# Patient Record
Sex: Male | Born: 1970 | Race: White | Hispanic: No | Marital: Married | Smoking: Never smoker
Health system: Southern US, Community
[De-identification: ages and names within clinical notes are randomized; demographics above are authoritative.]

## PROBLEM LIST (undated history)

## (undated) DIAGNOSIS — I10 Essential (primary) hypertension: Secondary | ICD-10-CM

## (undated) HISTORY — DX: Essential (primary) hypertension: I10

## (undated) HISTORY — PX: WISDOM TOOTH EXTRACTION: SHX21

---

## 2000-09-18 ENCOUNTER — Emergency Department (HOSPITAL_COMMUNITY): Admission: EM | Admit: 2000-09-18 | Discharge: 2000-09-18 | Payer: Self-pay

## 2002-04-15 ENCOUNTER — Emergency Department (HOSPITAL_COMMUNITY): Admission: EM | Admit: 2002-04-15 | Discharge: 2002-04-15 | Payer: Self-pay | Admitting: Emergency Medicine

## 2005-08-23 ENCOUNTER — Encounter: Admission: RE | Admit: 2005-08-23 | Discharge: 2005-08-23 | Payer: Self-pay | Admitting: Occupational Medicine

## 2007-07-25 ENCOUNTER — Encounter: Admission: RE | Admit: 2007-07-25 | Discharge: 2007-07-25 | Payer: Self-pay | Admitting: Occupational Medicine

## 2009-07-21 ENCOUNTER — Encounter: Admission: RE | Admit: 2009-07-21 | Discharge: 2009-07-21 | Payer: Self-pay | Admitting: Occupational Medicine

## 2013-09-29 ENCOUNTER — Ambulatory Visit: Payer: Self-pay

## 2013-09-29 ENCOUNTER — Other Ambulatory Visit: Payer: Self-pay | Admitting: Occupational Medicine

## 2013-09-29 DIAGNOSIS — Z0000 Encounter for general adult medical examination without abnormal findings: Secondary | ICD-10-CM

## 2015-07-14 ENCOUNTER — Ambulatory Visit (INDEPENDENT_AMBULATORY_CARE_PROVIDER_SITE_OTHER): Payer: BLUE CROSS/BLUE SHIELD | Admitting: Family Medicine

## 2015-07-14 VITALS — BP 118/84 | HR 66 | Temp 97.9°F | Resp 16 | Ht 67.0 in | Wt 182.2 lb

## 2015-07-14 DIAGNOSIS — R059 Cough, unspecified: Secondary | ICD-10-CM

## 2015-07-14 DIAGNOSIS — R6889 Other general symptoms and signs: Secondary | ICD-10-CM | POA: Diagnosis not present

## 2015-07-14 DIAGNOSIS — R05 Cough: Secondary | ICD-10-CM

## 2015-07-14 LAB — POCT INFLUENZA A/B
INFLUENZA A, POC: NEGATIVE
INFLUENZA B, POC: POSITIVE — AB

## 2015-07-14 MED ORDER — HYDROCOD POLST-CPM POLST ER 10-8 MG/5ML PO SUER: 5.0000 mL | Freq: Two times a day (BID) | ORAL | Status: DC | PRN

## 2015-07-14 MED ORDER — BENZONATATE 100 MG PO CAPS: 100.0000 mg | ORAL_CAPSULE | Freq: Three times a day (TID) | ORAL | Status: DC | PRN

## 2015-07-14 NOTE — Addendum Note (Signed)
Addended by: Felix AhmadiFRANSEN, Mathis Cashman A on: 07/14/2015 03:19 PM   Modules accepted: Kipp BroodSmartSet

## 2015-07-14 NOTE — Progress Notes (Signed)
Patient ID: William Ritter, male    DOB: 01/16/1971  Age: 45 y.o. MRN: [id number]  Chief Complaint  Patient presents with  . Sinusitis    x friday  . Sore Throat    x friday  . Cough    x friday  . Chills    Subjective:    45 year old man who is been sick since Saturday. He worked a shift on Friday and a long shift on Saturday.  He started with a cough Saturday. He has had body aches and chills. He went to work on Monday, but was still coughing terribly yesterday and today. He came in with his wife who is also ill. He does not smoke. He is not on any regular medications. He is generally a healthy person. Some head congestion. Mild sore throat. No ear pain. He does not smoke.  Current allergies, medications, problem list, past/family and social histories reviewed.  Objective:  BP 118/84 mmHg  Pulse 66  Temp(Src) 97.9 F (36.6 C) (Oral)  Resp 16  Ht  (1.702 m)  Wt 182 lb 3.2 oz (82.645 kg)  BMI 28.53 kg/m2  SpO2 98%  His TMs are normal. He is coughing constantly. His throat is clear but mildly erythematous. Neck supple without significant nodes. Chest clear to auscultation without wheezing currently though he says he has wheezes some. Heart is regular without murmurs.  Assessment & Plan:   Assessment: 1. Flu-like symptoms   2. Cough       Plan: Cough and flulike illness  Orders Placed This Encounter  Procedures  . POCT Influenza A/B    Meds ordered this encounter  Medications  . chlorpheniramine-HYDROcodone (TUSSIONEX PENNKINETIC ER) 10-8 MG/5ML SUER    Sig: Take 5 mLs by mouth every 12 (twelve) hours as needed for cough.    Dispense:  140 mL    Refill:  0  . benzonatate (TESSALON) 100 MG capsule    Sig: Take 1-2 capsules (100-200 mg total) by mouth 3 (three) times daily as needed.    Dispense:  30 capsule    Refill:  0   Results for orders placed or performed in visit on 07/14/15  POCT Influenza A/B  Result Value Ref Range   Influenza A, POC Negative  Negative   Influenza B, POC Positive (A) Negative         Patient Instructions  Drink plenty of fluids and get enough rest  Take the Tussionex cough syrup 1 teaspoon every 12 hours as needed for cough  Take the cough pills one or 2 pills 3 times daily as needed for cough. This tends to be nonsedating.  If you develop a lot of head congestion you can use an over-the-counter antihistamine decongestant such as Claritin-D or Allegra-D or Zyrtec-D  Return if worse  IF you received an x-ray today, you will receive an invoice from [facility]. Please contact [facility] at [phone] with questions or concerns regarding your invoice.   IF you received labwork today, you will receive an invoice from [employer] [facility]. Please contact [facility] at [phone] with questions or concerns regarding your invoice.   Our billing staff will not be able to assist you with questions regarding bills from these companies.  You will be contacted with the lab results as soon as they are available. The fastest way to get your results is to activate your My Chart account. Instructions are located on the last page of this paperwork. If you have not heard  from us regarding the results in 2 weeks, please contact this office.       Return if symptoms worsen or fail to improve.   Adleigh Mcmasters, MD 07/14/2015

## 2015-07-14 NOTE — Patient Instructions (Addendum)
Drink plenty of fluids and get enough rest  Take the Tussionex cough syrup 1 teaspoon every 12 hours as needed for cough  Take the cough pills one or 2 pills 3 times daily as needed for cough. This tends to be nonsedating.  If you develop a lot of head congestion you can use an over-the-counter antihistamine decongestant such as Claritin-D or Allegra-D or Zyrtec-D  Return if worse  IF you received an x-ray today, you will receive an invoice from [facility]. Please contact [facility] at [phone] with questions or concerns regarding your invoice.   IF you received labwork today, you will receive an invoice from [facility]. Please contact [facility] at [phone] with questions or concerns regarding your invoice.   Our billing staff will not be able to assist you with questions regarding bills from these companies.  You will be contacted with the lab results as soon as they are available. The fastest way to get your results is to activate your My Chart account. Instructions are located on the last page of this paperwork. If you have not heard from us regarding the results in 2 weeks, please contact this office.

## 2016-03-21 ENCOUNTER — Other Ambulatory Visit: Payer: Self-pay | Admitting: Occupational Medicine

## 2016-03-21 ENCOUNTER — Ambulatory Visit
Admission: RE | Admit: 2016-03-21 | Discharge: 2016-03-21 | Disposition: A | Discharge status: Home / Self Care | Payer: No Typology Code available for payment source | Source: Ambulatory Visit | Attending: Occupational Medicine | Admitting: Occupational Medicine

## 2016-03-21 DIAGNOSIS — Z0000 Encounter for general adult medical examination without abnormal findings: Secondary | ICD-10-CM

## 2018-03-19 IMAGING — CR DG CHEST 2V
2 series · 2 of 2 positions shown · non-contrast
Comparison: 09/29/2013.

CLINICAL DATA: Annual physical.

EXAM:
CHEST  2 VIEW

[w chest pa]
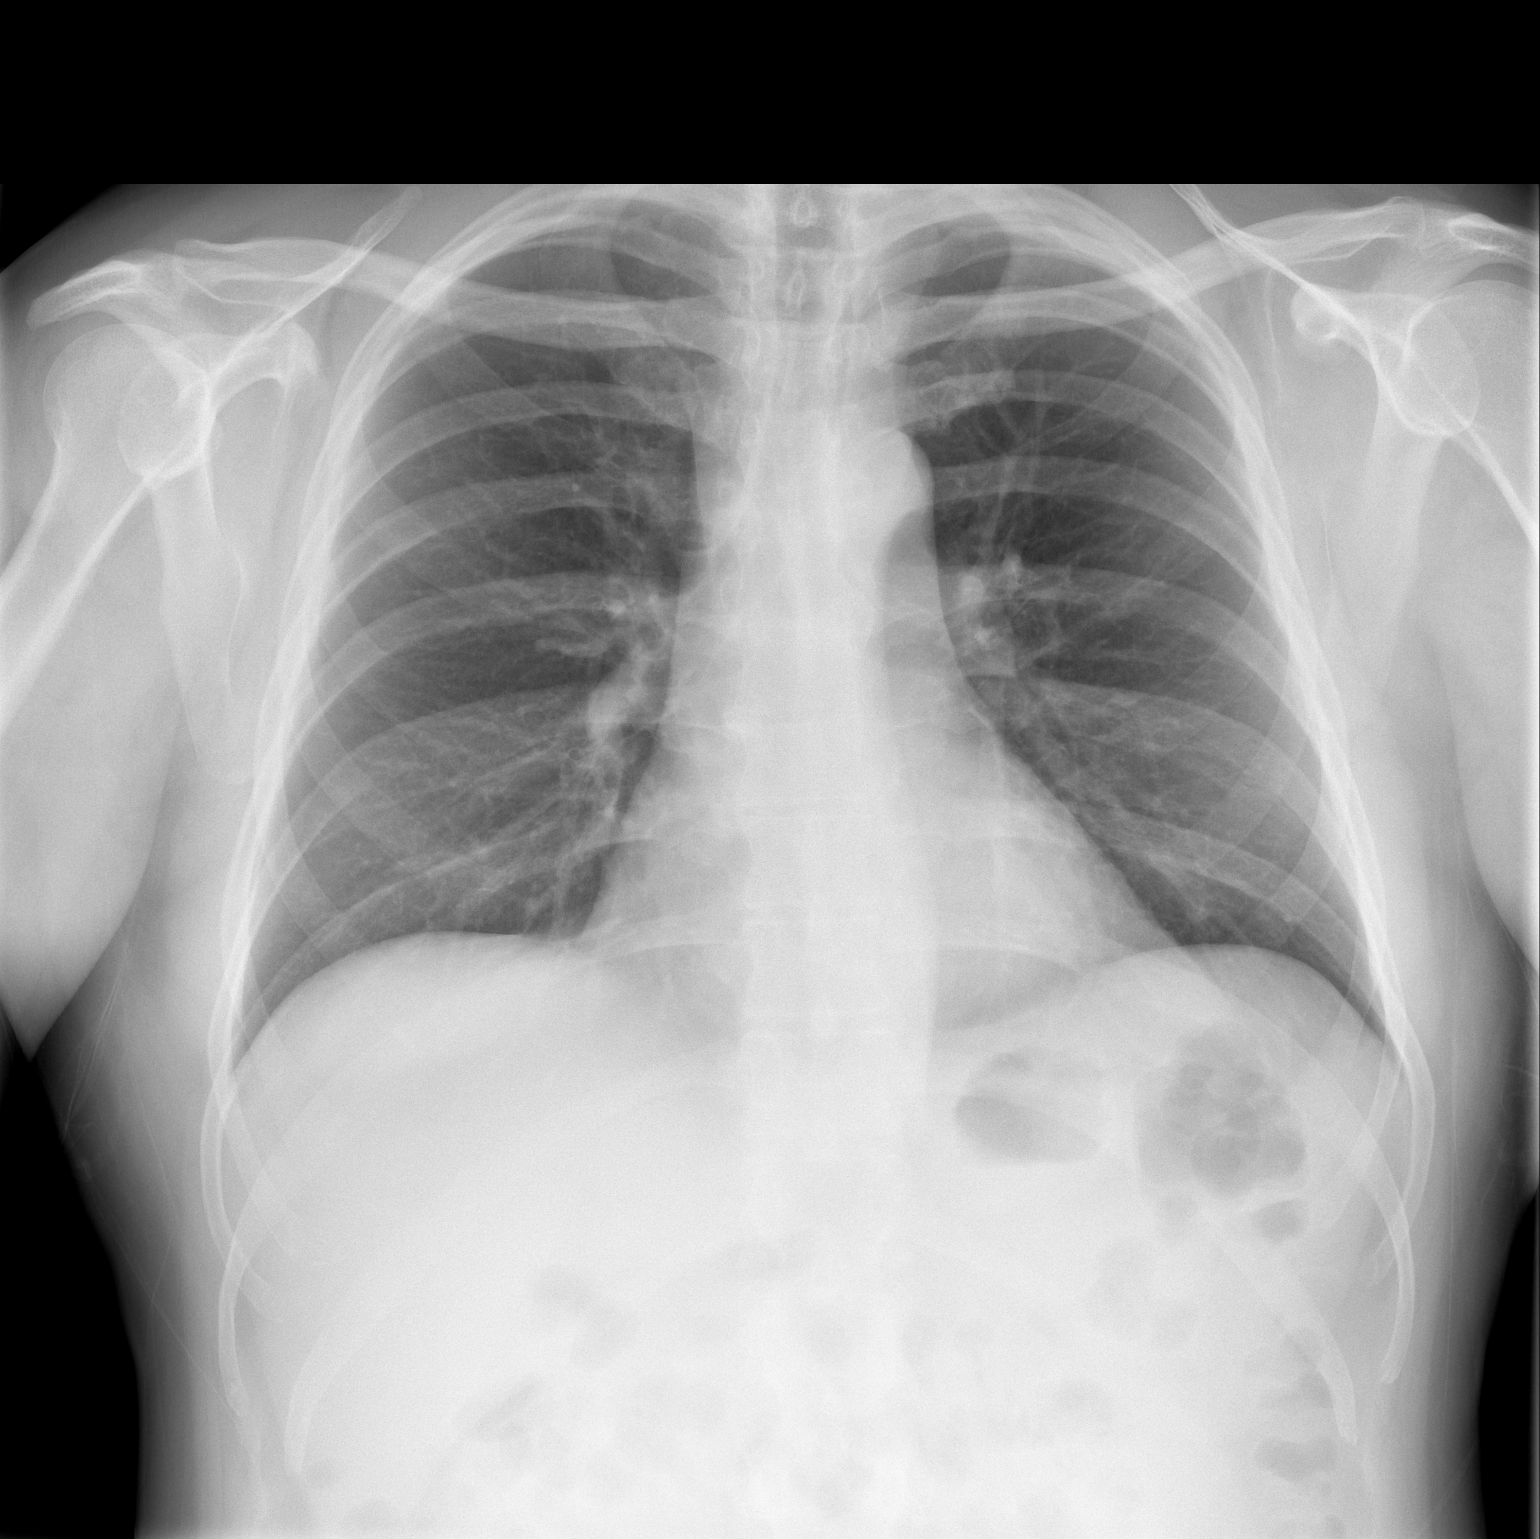

[w chest lat]
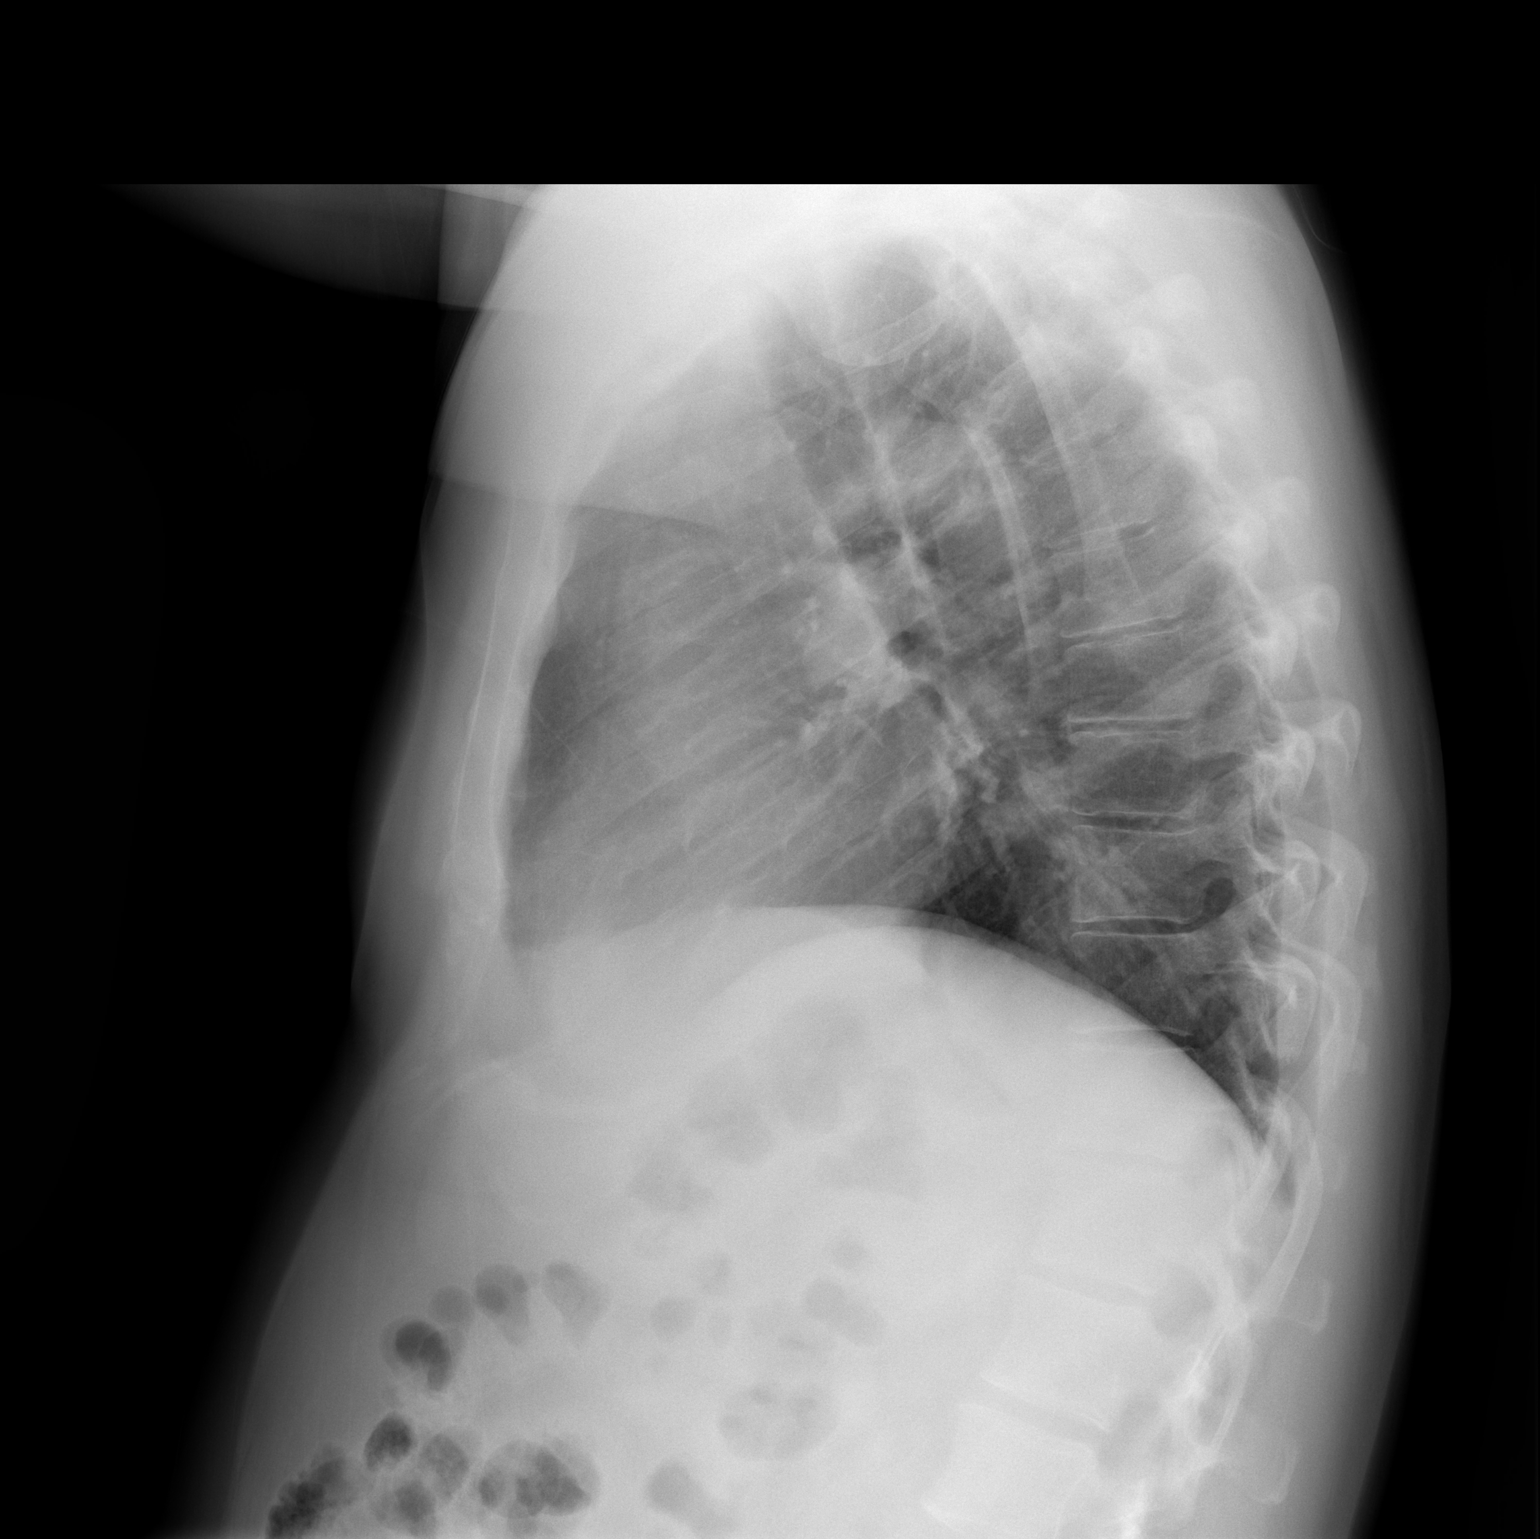

[2 of 2 positions shown; findings below may reference images not displayed]

FINDINGS: Mediastinum hilar structures are normal. Low lung volumes with mild
left base subsegmental atelectasis. No pleural effusion or
pneumothorax. No acute bony abnormality identified.
IMPRESSION: Low lung volumes with mild left base subsegmental atelectasis.

## 2022-02-08 ENCOUNTER — Encounter: Payer: Self-pay | Admitting: Cardiovascular Disease

## 2022-02-08 ENCOUNTER — Ambulatory Visit: Payer: Self-pay | Admitting: Cardiology

## 2022-02-08 ENCOUNTER — Ambulatory Visit: Payer: 59 | Attending: Cardiology | Admitting: Cardiovascular Disease

## 2022-02-08 VITALS — BP 140/88 | HR 65 | Ht 66.0 in | Wt 163.8 lb

## 2022-02-08 DIAGNOSIS — R011 Cardiac murmur, unspecified: Secondary | ICD-10-CM | POA: Diagnosis not present

## 2022-02-08 NOTE — Patient Instructions (Signed)
Medication Instructions:  No changes *If you need a refill on your cardiac medications before your next appointment, please call your pharmacy*   Lab Work: none If you have labs (blood work) drawn today and your tests are completely normal, you will receive your results only by: [place] (if you have MyChart) OR A paper copy in the mail If you have any lab test that is abnormal or we need to change your treatment, we will call you to review the results.   Testing/Procedures: Your physician has requested that you have an echocardiogram. Echocardiography is a painless test that uses sound waves to create images of your heart. It provides your doctor with information about the size and shape of your heart and how well your heart's chambers and valves are working. This procedure takes approximately one hour. There are no restrictions for this procedure.   Follow-Up: At [facility], you and your health needs are our priority.  As part of our continuing mission to provide you with exceptional heart care, we have created designated Provider Care Teams.  These Care Teams include your primary Cardiologist (physician) and Advanced Practice Providers (APPs -  Physician Assistants and Nurse Practitioners) who all work together to provide you with the care you need, when you need it.  We recommend signing up for the patient portal called "MyChart".  Sign up information is provided on this After Visit Summary.  MyChart is used to connect with patients for Virtual Visits (Telemedicine).  Patients are able to view lab/test results, encounter notes, upcoming appointments, etc.  Non-urgent messages can be sent to your provider as well.   To learn more about what you can do with MyChart, go to [email or web address].    Your next appointment:   12 month(s)  The format for your next appointment:   In Person  Provider:   Lauree Chandler, MD  Other Instructions   Important  Information About Sugar

## 2022-02-08 NOTE — Progress Notes (Signed)
Chief Complaint  Patient presents with   New Patient (Initial Visit)    Cardiac murmur    History of Present Illness:51 yo male with history of HTN who is here today as a new consult for evaluation of a cardiac murmur. He has had no prior cardiac issues. He has never been told that he had a murmur. Recently started on medical therapy for HTN. He feels great. He is very active. No chest pain or dyspnea. No LE edema.   Primary Care Physician: Etter Sjogren   Past Medical History:  Diagnosis Date   HTN (hypertension)     Past Surgical History:  Procedure Laterality Date   WISDOM TOOTH EXTRACTION      Current Outpatient Medications  Medication Sig Dispense Refill   lisinopril (ZESTRIL) 20 MG tablet Take 20 mg by mouth daily.     No current facility-administered medications for this visit.    Not on File  Social History   Socioeconomic History   Marital status: Married    Spouse name: Not on file   Number of children: 1   Years of education: Not on file   Highest education level: Not on file  Occupational History   Not on file  Tobacco Use   Smoking status: Never   Smokeless tobacco: Current  Substance and Sexual Activity   Alcohol use: Yes    Comment: 6 per day   Drug use: Never   Sexual activity: Not on file  Other Topics Concern   Not on file  Social History Narrative   Not on file   Social Determinants of Health   Financial Resource Strain: Not on file  Food Insecurity: Not on file  Transportation Needs: Not on file  Physical Activity: Not on file  Stress: Not on file  Social Connections: Not on file  Intimate Partner Violence: Not on file    Family History  Problem Relation Age of Onset   Diabetes Mellitus II Mother     Review of Systems:  As stated in the HPI and otherwise negative.   BP (!) 140/88   Pulse 65   Ht 5\' 6"  (1.676 m)   Wt 163 lb 12.8 oz (74.3 kg)   SpO2 93%   BMI 26.44 kg/m   Physical Examination: General:  Well developed, well nourished, NAD  HEENT: OP clear, mucus membranes moist  SKIN: warm, dry. No rashes. Neuro: No focal deficits  Musculoskeletal: Muscle strength 5/5 all ext  Psychiatric: Mood and affect normal  Neck: No JVD, no carotid bruits, no thyromegaly, no lymphadenopathy.  Lungs:Clear bilaterally, no wheezes, rhonci, crackles Cardiovascular: Regular rate and rhythm. Soft systolic murmur.  Abdomen:Soft. Bowel sounds present. Non-tender.  Extremities: No lower extremity edema. Pulses are 2 + in the bilateral DP/PT.  EKG:  EKG is ordered today. The ekg ordered today demonstrates NSR, LVH  Recent Labs: No results found for requested labs within last 365 days.   Lipid Panel No results found for: "CHOL", "TRIG", "HDL", "CHOLHDL", "VLDL", "LDLCALC", "LDLDIRECT"   Wt Readings from Last 3 Encounters:  02/08/22 163 lb 12.8 oz (74.3 kg)  07/14/15 182 lb 3.2 oz (82.6 kg)    Assessment and Plan:   1. Cardiac murmur: Will arrange an echo to assess. We will call him to discuss his results. One year f/u unless findings warrant more urgent assessment.   Labs/ tests ordered today include:   Orders Placed This Encounter  Procedures   EKG 12-Lead  ECHOCARDIOGRAM COMPLETE   Disposition:   F/U with me in one year.   Signed, Verne Carrow, MD, [facility] 02/08/2022 3:47 PM    [facility] [address] [place], [place], [place]  [address] Phone: [phone]; Fax: [phone]

## 2022-02-22 ENCOUNTER — Other Ambulatory Visit (HOSPITAL_COMMUNITY): Payer: 59

## 2022-02-28 ENCOUNTER — Ambulatory Visit (HOSPITAL_COMMUNITY): Payer: 59 | Attending: Cardiovascular Disease

## 2022-02-28 DIAGNOSIS — R011 Cardiac murmur, unspecified: Secondary | ICD-10-CM | POA: Diagnosis present

## 2022-02-28 LAB — ECHOCARDIOGRAM COMPLETE
Area-P 1/2: 4.86 cm2
S' Lateral: 4.1 cm

## 2022-03-03 ENCOUNTER — Telehealth: Payer: Self-pay | Admitting: Cardiovascular Disease

## 2022-03-03 DIAGNOSIS — R011 Cardiac murmur, unspecified: Secondary | ICD-10-CM

## 2022-03-03 NOTE — Telephone Encounter (Signed)
Patient is calling to receive echo results

## 2022-03-03 NOTE — Telephone Encounter (Signed)
Reviewed ECHO results with patient. Scheduled chest CT to assess the the aorta. Patient verbalized understanding.

## 2022-03-21 ENCOUNTER — Encounter (HOSPITAL_COMMUNITY): Payer: Self-pay

## 2022-03-21 ENCOUNTER — Ambulatory Visit (HOSPITAL_COMMUNITY)
Admission: RE | Admit: 2022-03-21 | Discharge: 2022-03-21 | Disposition: A | Discharge status: Home / Self Care | Payer: 59 | Source: Ambulatory Visit | Attending: Cardiovascular Disease | Admitting: Cardiovascular Disease

## 2022-03-21 DIAGNOSIS — R011 Cardiac murmur, unspecified: Secondary | ICD-10-CM | POA: Diagnosis present

## 2022-03-21 MED ORDER — SODIUM CHLORIDE (PF) 0.9 % IJ SOLN
INTRAMUSCULAR | Status: AC
  Filled 2022-03-21: qty 50

## 2022-03-21 MED ORDER — IOHEXOL 350 MG/ML SOLN
100.0000 mL | Freq: Once | INTRAVENOUS | Status: AC | PRN
  Administered 2022-03-21: 100 mL via INTRAVENOUS

## 2022-04-04 ENCOUNTER — Telehealth: Payer: Self-pay | Admitting: *Deleted

## 2022-04-04 DIAGNOSIS — Q231 Congenital insufficiency of aortic valve: Secondary | ICD-10-CM

## 2022-04-04 NOTE — Telephone Encounter (Signed)
Referral placed to [facility].  Left message for patient to call back and sent results to him in MyChart.

## 2022-04-04 NOTE — Telephone Encounter (Signed)
-----   Message from Kathleene Hazel, MD sent at 03/22/2022 10:48 AM EST ----- His ascending aorta is dilated. This will need to be followed with serial CT scans. He will need a referral to CT surgery. The aorta may not change for years but we need to keep a close eye on it. Thanks, Thayer Ohm

## 2022-04-26 DIAGNOSIS — I10 Essential (primary) hypertension: Secondary | ICD-10-CM | POA: Insufficient documentation

## 2022-05-04 ENCOUNTER — Institutional Professional Consult (permissible substitution): Payer: 59 | Admitting: Cardiothoracic Surgery

## 2022-05-04 VITALS — BP 144/88 | HR 86 | Resp 20 | Ht 66.0 in | Wt 163.0 lb

## 2022-05-04 DIAGNOSIS — I7121 Aneurysm of the ascending aorta, without rupture: Secondary | ICD-10-CM | POA: Diagnosis not present

## 2022-05-04 NOTE — Progress Notes (Signed)
[address]       [place] [address]             [place] Record #[id number] Date of Birth: [birth year]  Referring: Burnell Blanks* Primary Care: Etter Sjogren Primary Cardiologist: Lauree Chandler, MD  Chief Complaint:    Chief Complaint  Patient presents with   Thoracic Aortic Aneurysm    CHEST CTA 03/21/22     History of Present Illness:    William Ritter 52 y.o. male who saw Dr. Angelena Form back in October 2023 for new murmur. Thus turned out to be from a biscupid aortic valve that is working well. Noted have mild dilation of ascending aorta to 72mm at that time by echo.   Other relevant history includes: Sudden death of mother at early 70s. She didn't wake up from sleep. Otherwise had DM and overwieght  Had years of very high BP - 180/140s. Now under better control. 120/88.    Past Medical History:  Diagnosis Date   HTN (hypertension)     Past Surgical History:  Procedure Laterality Date   WISDOM TOOTH EXTRACTION      Family History  Problem Relation Age of Onset   Diabetes Mellitus II Mother      Social History   Tobacco Use  Smoking Status Never  Smokeless Tobacco Current   Types: Chew    Social History   Substance and Sexual Activity  Alcohol Use Yes   Alcohol/week: 35.0 standard drinks of alcohol   Types: 35 Cans of beer per week     No Known Allergies  Current Outpatient Medications  Medication Sig Dispense Refill   lisinopril (ZESTRIL) 30 MG tablet Take 30 mg by mouth daily.     lisinopril (ZESTRIL) 20 MG tablet Take 20 mg by mouth daily. (Patient not taking: Reported on 05/04/2022)     No current facility-administered medications for this visit.    ROS 14 point ROS reviewed and negative except as per HPI   PHYSICAL EXAMINATION: BP (!) 144/88   Pulse 86   Resp 20   Ht 5\' 6"  (1.676 m)   Wt 163 lb (73.9 kg)   SpO2 99%   BMI 26.31  kg/m   Gen: NAD Neuro: Alert and oriented, extremely anxious  CV: regular, + systolic murmur Resp: Nonlaboured Abd: Soft, ntnd Extr: WWP  Diagnostic Studies & Laboratory data:     Recent Radiology Findings:   DG Chest 1 View  Result Date: 05/09/2022 CLINICAL DATA:  52 year old male presenting with elevated troponin and back pain with fatigue. EXAM: CHEST  1 VIEW COMPARISON:  March 21, 2016 FINDINGS: The heart size and mediastinal contours are within normal limits. Both lungs are clear. The visualized skeletal structures are unremarkable. IMPRESSION: No active disease. Electronically Signed   By: Zetta Bills M.D.   On: 05/09/2022 12:47       I have independently reviewed the above radiology studies  and reviewed the findings with the patient.   Recent Lab Findings: Lab Results  Component Value Date   WBC 7.9 05/09/2022   HGB 16.5 05/09/2022   HCT 46.8 05/09/2022   PLT 249 05/09/2022   GLUCOSE 100 (H) 05/09/2022   ALT 30 05/07/2022   AST 25 05/07/2022   NA 135 05/09/2022   K 3.4 (L) 05/09/2022  CL 100 05/09/2022   CREATININE 0.96 05/09/2022   BUN 13 05/09/2022   CO2 22 05/09/2022     Assessment / Plan:   52 year old man with hx of HTN presents for ascending aortic aneurysm. By CT, it is 4.5cm.   Discussed aortic surveillance and symptoms of aortic dissection and rupture and that he would need immediate medical attention in the conditions. Discussed control of hypertension as important.   See back in 6 months with repeat CTA chest.    [name] 05/10/2022 2:55 PM

## 2022-05-07 ENCOUNTER — Other Ambulatory Visit: Payer: Self-pay

## 2022-05-07 ENCOUNTER — Emergency Department (HOSPITAL_COMMUNITY)
Admission: EM | Admit: 2022-05-07 | Discharge: 2022-05-08 | Discharge status: Against Medical Advice | Payer: 59 | Attending: Emergency Medicine | Admitting: Emergency Medicine

## 2022-05-07 DIAGNOSIS — Z5321 Procedure and treatment not carried out due to patient leaving prior to being seen by health care provider: Secondary | ICD-10-CM | POA: Insufficient documentation

## 2022-05-07 DIAGNOSIS — R6883 Chills (without fever): Secondary | ICD-10-CM | POA: Diagnosis not present

## 2022-05-07 DIAGNOSIS — F419 Anxiety disorder, unspecified: Secondary | ICD-10-CM | POA: Insufficient documentation

## 2022-05-07 DIAGNOSIS — R42 Dizziness and giddiness: Secondary | ICD-10-CM | POA: Diagnosis present

## 2022-05-07 LAB — URINALYSIS, ROUTINE W REFLEX MICROSCOPIC
Bilirubin Urine: NEGATIVE
Glucose, UA: NEGATIVE mg/dL
Hgb urine dipstick: NEGATIVE
Ketones, ur: 20 mg/dL — AB
Leukocytes,Ua: NEGATIVE
Nitrite: NEGATIVE
Protein, ur: NEGATIVE mg/dL
Specific Gravity, Urine: 1.017 (ref 1.005–1.030)
pH: 5 (ref 5.0–8.0)

## 2022-05-07 LAB — CBC WITH DIFFERENTIAL/PLATELET
Abs Immature Granulocytes: 0.01 10*3/uL (ref 0.00–0.07)
Basophils Absolute: 0 10*3/uL (ref 0.0–0.1)
Basophils Relative: 1 %
Eosinophils Absolute: 0 10*3/uL (ref 0.0–0.5)
Eosinophils Relative: 1 %
HCT: 48.6 % (ref 39.0–52.0)
Hemoglobin: 16.2 g/dL (ref 13.0–17.0)
Immature Granulocytes: 0 %
Lymphocytes Relative: 27 %
Lymphs Abs: 1.8 10*3/uL (ref 0.7–4.0)
MCH: 31.8 pg (ref 26.0–34.0)
MCHC: 33.3 g/dL (ref 30.0–36.0)
MCV: 95.3 fL (ref 80.0–100.0)
Monocytes Absolute: 0.6 10*3/uL (ref 0.1–1.0)
Monocytes Relative: 10 %
Neutro Abs: 4 10*3/uL (ref 1.7–7.7)
Neutrophils Relative %: 61 %
Platelets: 244 10*3/uL (ref 150–400)
RBC: 5.1 MIL/uL (ref 4.22–5.81)
RDW: 11.8 % (ref 11.5–15.5)
WBC: 6.5 10*3/uL (ref 4.0–10.5)
nRBC: 0 % (ref 0.0–0.2)

## 2022-05-07 LAB — COMPREHENSIVE METABOLIC PANEL
ALT: 30 U/L (ref 0–44)
AST: 25 U/L (ref 15–41)
Albumin: 4.4 g/dL (ref 3.5–5.0)
Alkaline Phosphatase: 45 U/L (ref 38–126)
Anion gap: 13 (ref 5–15)
BUN: 11 mg/dL (ref 6–20)
CO2: 25 mmol/L (ref 22–32)
Calcium: 9.4 mg/dL (ref 8.9–10.3)
Chloride: 97 mmol/L — ABNORMAL LOW (ref 98–111)
Creatinine, Ser: 1.04 mg/dL (ref 0.61–1.24)
GFR, Estimated: >60 mL/min (ref >60)
Glucose, Bld: 118 mg/dL — ABNORMAL HIGH (ref 70–99)
Potassium: 3.5 mmol/L (ref 3.5–5.1)
Sodium: 135 mmol/L (ref 135–145)
Total Bilirubin: 1 mg/dL (ref 0.3–1.2)
Total Protein: 7.4 g/dL (ref 6.5–8.1)

## 2022-05-07 LAB — TROPONIN I (HIGH SENSITIVITY): Troponin I (High Sensitivity): <2 ng/L (ref <18)

## 2022-05-07 NOTE — ED Triage Notes (Signed)
Patient reports feeling dizzy/lightheaded , with chills , feels clammy and anxious this evening , he is concerned about his heart valve disease. Denies chest pain /respirations unlabored.

## 2022-05-08 LAB — TROPONIN I (HIGH SENSITIVITY): Troponin I (High Sensitivity): <2 ng/L (ref <18)

## 2022-05-08 NOTE — ED Notes (Signed)
Patient called for vitals x1 

## 2022-05-08 NOTE — ED Notes (Signed)
Patient called for vitals x2 

## 2022-05-08 NOTE — ED Notes (Signed)
Patient called for vitals x3 

## 2022-05-09 ENCOUNTER — Other Ambulatory Visit: Payer: Self-pay

## 2022-05-09 ENCOUNTER — Encounter (HOSPITAL_COMMUNITY): Payer: Self-pay

## 2022-05-09 ENCOUNTER — Emergency Department (HOSPITAL_COMMUNITY): Payer: 59

## 2022-05-09 ENCOUNTER — Emergency Department (HOSPITAL_COMMUNITY)
Admission: EM | Admit: 2022-05-09 | Discharge: 2022-05-09 | Disposition: A | Discharge status: Home / Self Care | Payer: 59 | Attending: Emergency Medicine | Admitting: Emergency Medicine

## 2022-05-09 DIAGNOSIS — Z79899 Other long term (current) drug therapy: Secondary | ICD-10-CM | POA: Insufficient documentation

## 2022-05-09 DIAGNOSIS — I10 Essential (primary) hypertension: Secondary | ICD-10-CM

## 2022-05-09 DIAGNOSIS — R7989 Other specified abnormal findings of blood chemistry: Secondary | ICD-10-CM

## 2022-05-09 DIAGNOSIS — R778 Other specified abnormalities of plasma proteins: Secondary | ICD-10-CM | POA: Diagnosis not present

## 2022-05-09 DIAGNOSIS — E876 Hypokalemia: Secondary | ICD-10-CM | POA: Diagnosis not present

## 2022-05-09 DIAGNOSIS — R42 Dizziness and giddiness: Secondary | ICD-10-CM | POA: Diagnosis present

## 2022-05-09 LAB — BASIC METABOLIC PANEL
Anion gap: 13 (ref 5–15)
BUN: 13 mg/dL (ref 6–20)
CO2: 22 mmol/L (ref 22–32)
Calcium: 9.3 mg/dL (ref 8.9–10.3)
Chloride: 100 mmol/L (ref 98–111)
Creatinine, Ser: 0.96 mg/dL (ref 0.61–1.24)
GFR, Estimated: >60 mL/min (ref >60)
Glucose, Bld: 100 mg/dL — ABNORMAL HIGH (ref 70–99)
Potassium: 3.4 mmol/L — ABNORMAL LOW (ref 3.5–5.1)
Sodium: 135 mmol/L (ref 135–145)

## 2022-05-09 LAB — CBC
HCT: 46.8 % (ref 39.0–52.0)
Hemoglobin: 16.5 g/dL (ref 13.0–17.0)
MCH: 33 pg (ref 26.0–34.0)
MCHC: 35.3 g/dL (ref 30.0–36.0)
MCV: 93.6 fL (ref 80.0–100.0)
Platelets: 249 10*3/uL (ref 150–400)
RBC: 5 MIL/uL (ref 4.22–5.81)
RDW: 11.6 % (ref 11.5–15.5)
WBC: 7.9 10*3/uL (ref 4.0–10.5)
nRBC: 0 % (ref 0.0–0.2)

## 2022-05-09 LAB — TROPONIN I (HIGH SENSITIVITY)
Troponin I (High Sensitivity): 39 ng/L — ABNORMAL HIGH (ref <18)
Troponin I (High Sensitivity): 32 ng/L — ABNORMAL HIGH (ref <18)

## 2022-05-09 NOTE — ED Notes (Signed)
Patient verbalizes understanding of discharge instructions. Opportunity for questioning and answers were provided. Armband removed by staff, pt discharged from ED. Pt ambulatory to ED waiting room with steady gait.  

## 2022-05-09 NOTE — ED Triage Notes (Addendum)
Pt states he LWBS here 05/07/2021. Pt states he looked at his labs in [place] and seen they were abnormal so he came back to be evaluated. Pt states still having the dizziness he originally came in for over the weekend

## 2022-05-09 NOTE — ED Provider Triage Note (Signed)
Emergency Medicine Provider Triage Evaluation Note  William Ritter , a 52 y.o. male  was evaluated in triage.  Pt complains of abnormal labs.  According to patient he was here a couple days ago, left due to long wait.  Is back today as he felt that he had a elevated troponin according to his chart.  He is concerned about his elevated troponin, he has not had any chest pain however after follow-up with his cardiology on Thursday for his aneurysm, he is thinking he might of had a panic attack. History of CAD with his mother expired at the age of 53 from an MI. Of note, he is also complaining of increase in weakness in fatigue, did have an increase in his blood pressure medication from 10 mg to 30 mg.  Review of Systems  Positive: Chest pain, weakness Negative: Fever, cough, leg swelling  Physical Exam  There were no vitals taken for this visit. Gen:   Awake, no distress   Resp:  Normal effort  MSK:   Moves extremities without difficulty  Other:    Medical Decision Making  Medically screening exam initiated at 11:20 AM.  Appropriate orders placed.  William Ritter was informed that the remainder of the evaluation will be completed by another provider, this initial triage assessment does not replace that evaluation, and the importance of remaining in the ED until their evaluation is complete.     William Fitting, PA-C 05/09/22 1126

## 2022-05-09 NOTE — ED Provider Notes (Signed)
[facility] EMERGENCY DEPARTMENT Provider Note   CSN: [id number] Arrival date & time: 05/09/22  1109     History  Chief Complaint  Patient presents with   abnormal labs    William Ritter is a 52 y.o. male with history significant for hypertension and bicuspid aortic valve presents to the ED with concerns of abnormal labs from his ED visit on 05/07/22.  Patient states he left due to long wait times, but since reviewing his results in MyChart, he has felt very anxious about a problem with his heart.  Patient was recently evaluated by cardiology for a murmur and was found to have a bicuspid aortic valve on ECHO.  CTA was ordered as follow up for a mild dilation of his ascending thoracic aorta.  He states he does not have chest pain or shortness of breath, but has been having episodes of feeling dizzy and "terrible".  Patient did have recent increase in lisinopril from 10mg  to 30mg  which is when he began to experience dizziness symptoms while up and moving around.  Denies nausea, vomiting, diarrhea, urinary changes, recent illness.  He does report increased anxiety and poor diet over the last few days.       Home Medications Prior to Admission medications   Medication Sig Start Date End Date Taking? Authorizing Provider  lisinopril (ZESTRIL) 20 MG tablet Take 20 mg by mouth daily. Patient not taking: Reported on 05/04/2022 02/03/22   [provider]  lisinopril (ZESTRIL) 30 MG tablet Take 30 mg by mouth daily. 04/26/22   [provider]      Allergies    Patient has no known allergies.    Review of Systems   Review of Systems  Constitutional:  Negative for fever.  Respiratory:  Negative for chest tightness and shortness of breath.   Cardiovascular:  Negative for chest pain, palpitations and leg swelling.  Gastrointestinal:  Negative for diarrhea, nausea and vomiting.  Neurological:  Positive for dizziness. Negative for syncope, weakness and  light-headedness.  Psychiatric/Behavioral:  The patient is nervous/anxious.     Physical Exam Updated Vital Signs BP 118/73 (BP Location: Left Arm)   Pulse 76   Temp 98.3 F (36.8 C) (Oral)   Resp 16   Ht 5\' 6"  (1.676 m)   Wt 73.9 kg   SpO2 100%   BMI 26.30 kg/m  Physical Exam Vitals and nursing note reviewed.  Constitutional:      General: He is not in acute distress.    Appearance: He is not ill-appearing.  HENT:     Mouth/Throat:     Mouth: Mucous membranes are moist.     Pharynx: Oropharynx is clear.  Cardiovascular:     Rate and Rhythm: Normal rate and regular rhythm.     Pulses: Normal pulses.     Heart sounds: Murmur heard.  Pulmonary:     Effort: Pulmonary effort is normal. No respiratory distress.     Breath sounds: Normal breath sounds and air entry.  Abdominal:     General: Abdomen is flat. Bowel sounds are normal. There is no distension.     Palpations: Abdomen is soft.     Tenderness: There is no abdominal tenderness.  Musculoskeletal:     Right lower leg: No edema.     Left lower leg: No edema.  Skin:    General: Skin is warm and dry.     Capillary Refill: Capillary refill takes less than 2 seconds.  Neurological:  Mental Status: He is alert and oriented to person, place, and time. Mental status is at baseline.     Sensory: Sensation is intact.     Motor: Motor function is intact.     Coordination: Coordination is intact.     Gait: Gait is intact.     Comments: Patient is walking around in hallway, reports he is not currently dizzy.  He has normal gait.  Does not appear to be swaying.  He is not complaining of feeling lightheaded or that he may pass out.    Psychiatric:        Mood and Affect: Affect normal. Mood is anxious.        Behavior: Behavior normal.     Comments: Patient reports he feels anxious and cannot sit still.  He is pacing beside his hallway bed.        ED Results / Procedures / Treatments   Labs (all labs ordered are listed,  but only abnormal results are displayed) Labs Reviewed  BASIC METABOLIC PANEL - Abnormal; Notable for the following components:      Result Value   Potassium 3.4 (*)    Glucose, Bld 100 (*)    All other components within normal limits  TROPONIN I (HIGH SENSITIVITY) - Abnormal; Notable for the following components:   Troponin I (High Sensitivity) 39 (*)    All other components within normal limits  TROPONIN I (HIGH SENSITIVITY) - Abnormal; Notable for the following components:   Troponin I (High Sensitivity) 32 (*)    All other components within normal limits  CBC    EKG EKG Interpretation  Date/Time:  Tuesday May 09 2022 17:58:39 EST Ventricular Rate:  64 PR Interval:  158 QRS Duration: 94 QT Interval:  414 QTC Calculation: 427 R Axis:   19 Text Interpretation: Normal sinus rhythm Normal ECG When compared with ECG of 09-May-2022 11:23, No significant change since last tracing Confirmed by Davonna Belling [id number]) on 05/09/2022 7:13:04 PM  Radiology DG Chest 1 View  Result Date: 05/09/2022 CLINICAL DATA:  52 year old male presenting with elevated troponin and back pain with fatigue. EXAM: CHEST  1 VIEW COMPARISON:  March 21, 2016 FINDINGS: The heart size and mediastinal contours are within normal limits. Both lungs are clear. The visualized skeletal structures are unremarkable. IMPRESSION: No active disease. Electronically Signed   By: Zetta Bills M.D.   On: 05/09/2022 12:47    Procedures Procedures    Medications Ordered in ED Medications - No data to display  ED Course/ Medical Decision Making/ A&P                           Medical Decision Making  This patient presents to the ED with chief complaint(s) of dizziness, abnormal labs, anxiety related to recent cardiac diagnosis with pertinent past medical history of hypertension, bicuspid aortic valve .The complaint involves an extensive differential diagnosis and also carries with it a high risk of complications  and morbidity.    The differential diagnosis includes ACS, cardiac dysrhythmia, hypertension, hypotension, symptoms secondary to recent change in BP medication   The initial plan is to obtain baseline labs, ECG, and chest x-ray  Additional history obtained: Additional history obtained from  none Records reviewed  cardiology note from Dr. Angelena Form, ECHO and CTA results  Initial Assessment:   On exam, patient appears mildly anxious and is pacing beside his hallway bed.  He admits to feeling anxious and states that  he does not feel that he can sit still.  He states that he has been feeling this way since he had his consultation appointment with CT surgery this past Thursday.  Heart rate is normal with regular rhythm.  Murmur is appreciated.  Lungs are clear to auscultation bilaterally.  Abdomen is soft and nontender.  Skin is warm and dry nondiaphoretic.  He is currently denying any chest pain or shortness of breath.  Independent ECG/labs interpretation:  The following labs were independently interpreted:  ECG demonstrates sinus rhythm without evidence of ectopy, infarction, or ischemia. CBC without leukocytosis or anemia. Metabolic panel reveals mild hypokalemia at 3.4, no other electrolyte disturbance.  Kidney function is within normal range. Initial troponin is 39, repeat troponin 32.  Independent visualization and interpretation of imaging: I independently visualized the following imaging with scope of interpretation limited to determining acute life threatening conditions related to emergency care: Chest x-ray, which revealed no evidence of pneumothorax, pleural effusion, or infiltrate.  Treatment and Reassessment: Patient reports he has had poor appetite due to anxiety and has not eaten anything in the last 24 hours.  Will allow patient to eat and drink while awaiting laboratory workup results.  He reports he is currently feeling well.  He has been ambulatory throughout his time in ED and  has not experienced chest pain, shortness of breath, dizziness, lightheadedness, or syncope.  He was able to tolerate PO intake.  Reports occasional nausea.  Can not rule out possibility patient's new onset of dizziness, fatigue, and intermittent nausea are related to developing viral illness.  Upon further discussion, patient states he stopped taking the 30mg  lisinopril due to feeling worse and went back to 10mg  daily.  His most recent BP was normotensive and improved since his initial arrival.  Patient has good primary care follow-up.   Consultations Obtained: I requested consultation with cardiology and spoke with on-call provider, Dr. , discussed HPI, elevated troponin, and other work-up.  He recommended patient follow-up closely with his cardiologist and does not feel he requires hospital admission or inpatient workup.    Disposition:   The patient has been appropriately medically screened and/or stabilized in the ED. I have low suspicion for any other emergent medical condition which would require further screening, evaluation or treatment in the ED or require inpatient management. At time of discharge the patient is hemodynamically stable and in no acute distress. I have discussed work-up results and diagnosis with patient and answered all questions. Patient is agreeable with discharge plan. We discussed strict return precautions for returning to the emergency department and they verbalized understanding.  Will have patient follow up with Dr. with cardiology and his primary care physician.            Final Clinical Impression(s) / ED Diagnoses Final diagnoses:  Dizziness  Elevated troponin  Essential hypertension    Rx / DC Orders ED Discharge Orders          Ordered    Ambulatory referral to Cardiology       Comments: If you have not heard from the Cardiology office within the next 72 hours please call [phone].   05/09/22 1957               [phone], PA 05/09/22 Lenard Simmer, MD 05/09/22 [phone]

## 2022-05-09 NOTE — ED Notes (Signed)
Pt states last ETOH use was last Thurs

## 2022-05-09 NOTE — Discharge Instructions (Signed)
Thank you for allowing me to be part of your care today.  I recommend that you schedule an appointment with Dr. Angelena Form with cardiology for follow-up.  I also recommend you follow-up again with your primary care provider regarding your blood pressure medication.  Please continue to stay well-hydrated and eat a balanced diet.  Return to the ER if you develop any worsening of your symptoms or have any new concerns including but not limited to chest pain, shortness of breath, or loss of consciousness.

## 2022-10-05 ENCOUNTER — Other Ambulatory Visit: Payer: Self-pay | Admitting: Cardiothoracic Surgery

## 2022-10-05 DIAGNOSIS — I7121 Aneurysm of the ascending aorta, without rupture: Secondary | ICD-10-CM

## 2022-10-19 ENCOUNTER — Encounter: Payer: Self-pay | Admitting: Cardiothoracic Surgery

## 2022-11-06 ENCOUNTER — Ambulatory Visit: Payer: 59 | Admitting: Cardiothoracic Surgery

## 2022-11-06 ENCOUNTER — Inpatient Hospital Stay: Admission: RE | Admit: 2022-11-06 | Payer: 59 | Source: Ambulatory Visit

## 2022-11-06 ENCOUNTER — Encounter: Payer: Self-pay | Admitting: Cardiothoracic Surgery

## 2023-01-31 ENCOUNTER — Ambulatory Visit: Payer: Self-pay

## 2023-01-31 ENCOUNTER — Ambulatory Visit
Admission: EM | Admit: 2023-01-31 | Discharge: 2023-01-31 | Disposition: A | Discharge status: Home / Self Care | Payer: 59 | Attending: Internal Medicine | Admitting: Internal Medicine

## 2023-01-31 DIAGNOSIS — Z760 Encounter for issue of repeat prescription: Secondary | ICD-10-CM | POA: Diagnosis not present

## 2023-01-31 MED ORDER — LISINOPRIL 20 MG PO TABS: 20.0000 mg | ORAL_TABLET | Freq: Every day | ORAL | 0 refills | Status: AC

## 2023-01-31 NOTE — ED Notes (Signed)
Last PCP Note (Visit):  05/15/2022 text/html HPI Notes: 53 y.o. M present for hypertension f/u, states adverse reaction to lisinopril 30 mg. States it was too strong and caused him to feel ill. States he tolerated the 20 mg and started to take those but only had a few left. He has made some changes to his diet. States bp has been tolerated since Fri. Salvatore Decent, PA-C [address], [place],   [place], [id number], [place]   [place] [facility] [place] [facility] 05/15/2022 08:46:57

## 2023-01-31 NOTE — Discharge Instructions (Addendum)
  Make an Appointment Call [phone] to schedule an appointment. [facility] at [facility]. Unable to make visit using MyChart, Patient will call.   Get your CT scan done as instructed by your provider Or unable to get an appointment with the primary care at [facility] follow-up with your current primary care provider

## 2023-01-31 NOTE — ED Provider Notes (Signed)
[facility]    CSN: [id number] Arrival date & time: 01/31/23  1334      History   Chief Complaint Chief Complaint  Patient presents with   BP Check with Medication Refill    HPI William Ritter is a 52 y.o. male.   HPI Requesting refill of his blood pressure medication.  Takes lisinopril 20 mg.  States he ran out a little while ago but had a prescription at home for 30 mg tablets which she has been cutting in half.  Took his last dose yesterday.  States his PCP had changed him to 30 mg however he felt very sluggish on it so his doctor changed him back to 20 mg. Denies chest pain, shortness of breath, headache, dizziness Denies smoking  Past Medical History:  Diagnosis Date   HTN (hypertension)     Patient Active Problem List   Diagnosis Date Noted   Essential hypertension 04/26/2022    Past Surgical History:  Procedure Laterality Date   WISDOM TOOTH EXTRACTION         Home Medications    Prior to Admission medications   Medication Sig Start Date End Date Taking? Authorizing Provider  lisinopril (ZESTRIL) 30 MG tablet Take 30 mg by mouth daily. "I took a piece of one yesterday morning, I had to recently cut them in half because I ran out of them". 04/26/22  Yes [provider]  lisinopril (ZESTRIL) 20 MG tablet Take 20 mg by mouth daily. Patient not taking: Reported on 05/04/2022 02/03/22   [provider]  lisinopril (ZESTRIL) 20 MG tablet Take 1 tablet by mouth daily. Last taken: July 2024, changed from 30mg  (by patient)    [provider]  lisinopril (ZESTRIL) 30 MG tablet Take 1 tablet by mouth daily.    [provider]    Family History Family History  Problem Relation Age of Onset   Diabetes Mellitus II Mother     Social History Social History   Tobacco Use   Smoking status: Never   Smokeless tobacco: Current    Types: Chew  Vaping Use   Vaping status: Never Used  Substance Use Topics   Alcohol use:  Yes    Alcohol/week: 35.0 standard drinks of alcohol    Types: 35 Cans of beer per week    Comment: Occassionally.   Drug use: Never     Allergies   Patient has no known allergies.   Review of Systems Review of Systems  Constitutional:  Negative for diaphoresis.  Respiratory:  Negative for shortness of breath.   Cardiovascular:  Negative for chest pain, palpitations and leg swelling.  Neurological:  Negative for dizziness and headaches.     Physical Exam Triage Vital Signs ED Triage Vitals  Encounter Vitals Group     BP 01/31/23 1349 (!) 146/87     Systolic BP Percentile --      Diastolic BP Percentile --      Pulse Rate 01/31/23 1349 (!) 58     Resp 01/31/23 1349 18     Temp 01/31/23 1349 98.3 F (36.8 C)     Temp Source 01/31/23 1349 Oral     SpO2 01/31/23 1349 98 %     Weight 01/31/23 1346 170 lb (77.1 kg)     Height 01/31/23 1346 5\' 6"  (1.676 m)     Head Circumference --      Peak Flow --      Pain Score 01/31/23 1343 0  Pain Loc --      Pain Education --      Exclude from Growth Chart --    No data found.  Updated Vital Signs BP 133/81 (BP Location: Left Arm)   Pulse 63   Temp 98.3 F (36.8 C) (Oral)   Resp 18   Ht 5\' 6"  (1.676 m)   Wt 170 lb (77.1 kg)   SpO2 98%   BMI 27.44 kg/m   Visual Acuity Right Eye Distance:   Left Eye Distance:   Bilateral Distance:    Right Eye Near:   Left Eye Near:    Bilateral Near:     Physical Exam Vitals and nursing note reviewed.  Constitutional:      Appearance: He is not ill-appearing.  Cardiovascular:     Rate and Rhythm: Normal rate and regular rhythm.     Heart sounds: Normal heart sounds.  Pulmonary:     Effort: Pulmonary effort is normal.     Breath sounds: Normal breath sounds.  Skin:    General: Skin is warm and dry.  Neurological:     Mental Status: He is alert and oriented to person, place, and time.  Psychiatric:        Mood and Affect: Mood normal.      UC Treatments / Results   Labs (all labs ordered are listed, but only abnormal results are displayed) Labs Reviewed - No data to display  EKG   Radiology No results found.  Procedures Procedures (including critical care time)  Medications Ordered in UC Medications - No data to display  Initial Impression / Assessment and Plan / UC Course  I have reviewed the triage vital signs and the nursing notes.  Pertinent labs & imaging results that were available during my care of the patient were reviewed by me and considered in my medical decision making (see chart for details).     52 year old with history of hypertension recently ran out of his lisinopril, was taking leftover lisinopril 30 that he was cutting in half.  He is requesting a refill of his 20 mg.  But is considering changing to a new PCP. He denies current symptoms. 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 Cautions reviewed Final Clinical Impressions(s) / UC Diagnoses   Final diagnoses:  None     Discharge Instructions       Make an Appointment Call [phone] to schedule an appointment. [facility] at [facility] Unable to make visit using MyChart, Patient will call.     ED Prescriptions   None    PDMP not reviewed this encounter.   Meliton Rattan, [place] 01/31/23 1409

## 2023-01-31 NOTE — ED Triage Notes (Signed)
"  I need my BP checked & my wife has been bitching to get my BP checked and medication refilled". My primary care provider is [facility], Last appt: 05-2022, changed from 30 mg to 20 mg. "I have not reached out to my PCP office, I don't know how to contact them".

## 2023-01-31 NOTE — ED Notes (Signed)
Setting up new PCP. Assisting with Mychart.  B. Roten CMA

## 2023-03-07 ENCOUNTER — Other Ambulatory Visit: Payer: Self-pay

## 2023-03-07 ENCOUNTER — Encounter: Payer: Self-pay | Admitting: Emergency Medicine

## 2023-03-07 ENCOUNTER — Emergency Department
Admission: EM | Admit: 2023-03-07 | Discharge: 2023-03-07 | Disposition: A | Discharge status: Home / Self Care | Payer: 59 | Attending: Emergency Medicine | Admitting: Emergency Medicine

## 2023-03-07 DIAGNOSIS — E871 Hypo-osmolality and hyponatremia: Secondary | ICD-10-CM | POA: Insufficient documentation

## 2023-03-07 DIAGNOSIS — I10 Essential (primary) hypertension: Secondary | ICD-10-CM | POA: Insufficient documentation

## 2023-03-07 DIAGNOSIS — R531 Weakness: Secondary | ICD-10-CM

## 2023-03-07 LAB — URINALYSIS, ROUTINE W REFLEX MICROSCOPIC
Bilirubin Urine: NEGATIVE
Glucose, UA: NEGATIVE mg/dL
Hgb urine dipstick: NEGATIVE
Ketones, ur: 20 mg/dL — AB
Leukocytes,Ua: NEGATIVE
Nitrite: NEGATIVE
Protein, ur: NEGATIVE mg/dL
Specific Gravity, Urine: 1.02 (ref 1.005–1.030)
pH: 5 (ref 5.0–8.0)

## 2023-03-07 LAB — BASIC METABOLIC PANEL
Anion gap: 10 (ref 5–15)
BUN: 12 mg/dL (ref 6–20)
CO2: 24 mmol/L (ref 22–32)
Calcium: 8.9 mg/dL (ref 8.9–10.3)
Chloride: 99 mmol/L (ref 98–111)
Creatinine, Ser: 0.95 mg/dL (ref 0.61–1.24)
GFR, Estimated: >60 mL/min (ref >60)
Glucose, Bld: 101 mg/dL — ABNORMAL HIGH (ref 70–99)
Potassium: 3.5 mmol/L (ref 3.5–5.1)
Sodium: 133 mmol/L — ABNORMAL LOW (ref 135–145)

## 2023-03-07 LAB — CBC
HCT: 46.7 % (ref 39.0–52.0)
Hemoglobin: 16.1 g/dL (ref 13.0–17.0)
MCH: 31.8 pg (ref 26.0–34.0)
MCHC: 34.5 g/dL (ref 30.0–36.0)
MCV: 92.1 fL (ref 80.0–100.0)
Platelets: 253 10*3/uL (ref 150–400)
RBC: 5.07 MIL/uL (ref 4.22–5.81)
RDW: 11.5 % (ref 11.5–15.5)
WBC: 7.2 10*3/uL (ref 4.0–10.5)
nRBC: 0 % (ref 0.0–0.2)

## 2023-03-07 MED ORDER — THIAMINE HCL 100 MG PO TABS: 100.0000 mg | ORAL_TABLET | Freq: Every day | ORAL | 0 refills | Status: AC

## 2023-03-07 MED ORDER — FOLIC ACID 1 MG PO TABS: 1.0000 mg | ORAL_TABLET | Freq: Every day | ORAL | 0 refills | Status: AC

## 2023-03-07 NOTE — ED Triage Notes (Signed)
Pt states he just has not felt right for 2 days. Pt states he quit drinking cold [place] 10 days ago after drinking for almost 40 years. Pt states he just feels worried and lightheaded. PT has been drinking electrolytes which he states has helped but he just doesn't feel right and concerned it may be his blood pressure. Denies any pain.

## 2023-03-07 NOTE — ED Provider Notes (Signed)
[facility] Provider Note    Event Date/Time   First MD Initiated Contact with Patient 03/07/23 2025     (approximate)   History   Weakness   HPI William Ritter is a 52 y.o. male with history of HTN, alcohol abuse disorder presenting today for generalized fatigue.  Patient states he quit alcohol cold [place] 10 days ago.  Over the past several days he describes himself as feeling "lousy".  Otherwise denies chest pain, fever, chills, shortness of breath, abdominal pain, nausea, vomiting, diaphoresis, or bodyaches.  Was concerned his blood pressure may be high but that was within normal limits today.     Physical Exam   Triage Vital Signs: ED Triage Vitals [03/07/23 1826]  Encounter Vitals Group     BP (!) 133/97     Systolic BP Percentile      Diastolic BP Percentile      Pulse Rate 62     Resp 18     Temp 98.2 F (36.8 C)     Temp Source Oral     SpO2 100 %     Weight 163 lb (73.9 kg)     Height 5\' 6"  (1.676 m)     Head Circumference      Peak Flow      Pain Score 0     Pain Loc      Pain Education      Exclude from Growth Chart     Most recent vital signs: Vitals:   03/07/23 1826  BP: (!) 133/97  Pulse: 62  Resp: 18  Temp: 98.2 F (36.8 C)  SpO2: 100%   I have reviewed the vital signs. General:  Awake, alert, no acute distress. Head:  Normocephalic, Atraumatic. EENT:  PERRL, EOMI, Oral mucosa pink and moist, Neck is supple. Cardiovascular: Regular rate, 2+ distal pulses. Respiratory:  Normal respiratory effort, symmetrical expansion, no distress.   Extremities:  Moving all four extremities through full ROM without pain.   Neuro:  Alert and oriented.  Interacting appropriately.   Skin:  Warm, dry, no rash.   Psych: Appropriate affect.    ED Results / Procedures / Treatments   Labs (all labs ordered are listed, but only abnormal results are displayed) Labs Reviewed  BASIC METABOLIC PANEL - Abnormal; Notable for the following  components:      Result Value   Sodium 133 (*)    Glucose, Bld 101 (*)    All other components within normal limits  URINALYSIS, ROUTINE W REFLEX MICROSCOPIC - Abnormal; Notable for the following components:   Color, Urine YELLOW (*)    APPearance CLEAR (*)    Ketones, ur 20 (*)    All other components within normal limits  CBC     EKG    RADIOLOGY    PROCEDURES:  Critical Care performed: No  Procedures   MEDICATIONS ORDERED IN ED: Medications - No data to display   IMPRESSION / MDM / ASSESSMENT AND PLAN / ED COURSE  I reviewed the triage vital signs and the nursing notes.                              Differential diagnosis includes, but is not limited to, alcohol withdrawal, vitamin deficiency  Patient's presentation is most consistent with acute complicated illness / injury requiring diagnostic workup.  Patient is a 52 year old male presenting today for generalized fatigue after quitting alcohol cold  [place] 10 days ago.  Patient was a 12 to 15 beer per day drinker.  Other than generalized fatigue, has no acute symptoms at this time.  Vital signs stable.  Physical exam unremarkable.  Laboratory workup shows mild hyponatremia but otherwise reassuring workup.  There are some ketones in his urine suggesting possible dehydration.  I suspect today's symptoms are largely combination of dehydration and likely vitamin deficiency.  Patient otherwise safe for discharge and tolerating p.o. intake.  Sent out with 1 month of folate and thiamine and told to use a multivitamin.  Also told to follow-up with primary care provider for reassessment.  Given strict return precautions.     FINAL CLINICAL IMPRESSION(S) / ED DIAGNOSES   Final diagnoses:  Generalized weakness     Rx / DC Orders   ED Discharge Orders          Ordered    folic acid (FOLVITE) 1 MG tablet  Daily        03/07/23 2053    thiamine (VITAMIN B1) 100 MG tablet  Daily        03/07/23 2053              Note:  This document was prepared using Dragon voice recognition software and may include unintentional dictation errors.   Janith Lima, MD 03/07/23 [phone]

## 2023-03-07 NOTE — Discharge Instructions (Addendum)
Please follow-up with your primary care provider.  Please pick up the vitamins as sent to your pharmacy and take over the next 4 weeks.  I would also like you to pick up over-the-counter multivitamins and take 1 a day at home as well.  Please return for any worsening symptoms.
